# Patient Record
Sex: Male | Born: 1970 | Race: Asian | Hispanic: No | Marital: Married | Smoking: Former smoker
Health system: Southern US, Community
[De-identification: ages and names within clinical notes are randomized; demographics above are authoritative.]

## PROBLEM LIST (undated history)

## (undated) DIAGNOSIS — E785 Hyperlipidemia, unspecified: Secondary | ICD-10-CM

## (undated) DIAGNOSIS — M109 Gout, unspecified: Secondary | ICD-10-CM

## (undated) HISTORY — DX: Gout, unspecified: M10.9

## (undated) HISTORY — DX: Hyperlipidemia, unspecified: E78.5

---

## 2019-11-05 ENCOUNTER — Ambulatory Visit: Payer: Self-pay | Attending: Internal Medicine

## 2019-11-05 DIAGNOSIS — Z23 Encounter for immunization: Secondary | ICD-10-CM

## 2019-11-05 NOTE — Progress Notes (Signed)
   Covid-19 Vaccination Clinic  Name:  Tommaso Cavitt    MRN: [id number] DOB: 1971-04-27  11/05/2019  Mr. Deverick was observed post Covid-19 immunization for 15 minutes without incident. He was provided with Vaccine Information Sheet and instruction to access the V-Safe system.   Mr. Arran was instructed to call 911 with any severe reactions post vaccine: Marland Kitchen Difficulty breathing  . Swelling of face and throat  . A fast heartbeat  . A bad rash all over body  . Dizziness and weakness   Immunizations Administered    Name Date Dose VIS Date Route   Moderna COVID-19 Vaccine 11/05/2019 12:46 PM 0.5 mL 06/2019 Intramuscular   Manufacturer: Moderna   Lot: [id number]   NDC: 74715-953-96

## 2019-12-03 ENCOUNTER — Ambulatory Visit: Payer: Self-pay | Attending: Internal Medicine

## 2019-12-03 DIAGNOSIS — Z23 Encounter for immunization: Secondary | ICD-10-CM

## 2019-12-03 NOTE — Progress Notes (Signed)
   Covid-19 Vaccination Clinic  Name:  Jeremy Flynn    MRN: [id number] DOB: November 02, 1970  12/03/2019  Mr. Jeremy Flynn was observed post Covid-19 immunization for 15 minutes without incident. He was provided with Vaccine Information Sheet and instruction to access the V-Safe system.   Mr. Jeremy Flynn was instructed to call 911 with any severe reactions post vaccine: Marland Kitchen Difficulty breathing  . Swelling of face and throat  . A fast heartbeat  . A bad rash all over body  . Dizziness and weakness   Immunizations Administered    Name Date Dose VIS Date Route   Moderna COVID-19 Vaccine 12/03/2019 12:48 PM 0.5 mL 06/2019 Intramuscular   Manufacturer: Moderna   Lot: [id number]   NDC: 33582-518-98

## 2020-03-06 ENCOUNTER — Other Ambulatory Visit: Payer: Self-pay

## 2020-03-06 ENCOUNTER — Ambulatory Visit: Payer: Self-pay

## 2020-03-06 ENCOUNTER — Ambulatory Visit (INDEPENDENT_AMBULATORY_CARE_PROVIDER_SITE_OTHER): Payer: No Typology Code available for payment source | Admitting: Family Medicine

## 2020-03-06 ENCOUNTER — Ambulatory Visit (HOSPITAL_COMMUNITY)
Admission: EM | Admit: 2020-03-06 | Discharge: 2020-03-06 | Disposition: A | Discharge status: Home / Self Care | Payer: No Typology Code available for payment source

## 2020-03-06 ENCOUNTER — Encounter (HOSPITAL_COMMUNITY): Payer: Self-pay | Admitting: Emergency Medicine

## 2020-03-06 ENCOUNTER — Encounter: Payer: Self-pay | Admitting: Family Medicine

## 2020-03-06 ENCOUNTER — Ambulatory Visit (INDEPENDENT_AMBULATORY_CARE_PROVIDER_SITE_OTHER): Payer: No Typology Code available for payment source

## 2020-03-06 VITALS — HR 78 | Ht 64.0 in | Wt 145.0 lb

## 2020-03-06 DIAGNOSIS — R358 Other polyuria: Secondary | ICD-10-CM | POA: Diagnosis not present

## 2020-03-06 DIAGNOSIS — M25571 Pain in right ankle and joints of right foot: Secondary | ICD-10-CM

## 2020-03-06 DIAGNOSIS — R3589 Other polyuria: Secondary | ICD-10-CM

## 2020-03-06 DIAGNOSIS — M10071 Idiopathic gout, right ankle and foot: Secondary | ICD-10-CM | POA: Diagnosis not present

## 2020-03-06 DIAGNOSIS — M79671 Pain in right foot: Secondary | ICD-10-CM

## 2020-03-06 DIAGNOSIS — R2241 Localized swelling, mass and lump, right lower limb: Secondary | ICD-10-CM

## 2020-03-06 MED ORDER — IBUPROFEN 600 MG PO TABS: 600.0000 mg | ORAL_TABLET | Freq: Three times a day (TID) | ORAL | 0 refills | Status: AC | PRN

## 2020-03-06 MED ORDER — PREDNISONE 5 MG PO TABS: ORAL_TABLET | ORAL | 0 refills | Status: DC

## 2020-03-06 NOTE — Discharge Instructions (Addendum)
Your x-ray did not show any fractures or broken bones.  There could be some ligament injury We will put you in a cam walker boot and have you follow with orthopedics. Rest, ice, elevate and ibuprofen for pain as needed.

## 2020-03-06 NOTE — Patient Instructions (Signed)
NIce to meet you Please try the prednisone  You can stop the CAM walker once the pain is gone  I will call with the results from today   Please send me a message in MyChart with any questions or updates.  Please see me back in 2-3 weeks.   --Dr. Jordan Likes

## 2020-03-06 NOTE — Assessment & Plan Note (Signed)
Significant effusion without injury of the ankle joint.  Hyperechoic area to suggest gouty origin based on ultrasound. -Counseled supportive care. -Has some polyuria and has not been to the doctor in several years.  Will check kidney function and A1c if he is diabetic. -Prednisone. -Could consider uric acid going forward.

## 2020-03-06 NOTE — Progress Notes (Signed)
Jeremy Flynn - 50 y.o. male MRN [id number]  Date of birth: 01-27-1971  SUBJECTIVE:  Including CC & ROS.  Chief Complaint  Patient presents with  . Ankle Injury    right x 2 weeks    Jeremy Flynn is a 49 y.o. male that is presenting with right ankle pain.  Denies any inciting event or trauma.  Has been ongoing for 2 weeks.  Does get improvement with ibuprofen but the pain returns.  Has limited range of motion in his ankle.  It is worse with long periods of standing.  No history of surgery.  No history of similar pain.  It is localized to the ankle joint..  Independent review of the right ankle x-ray from 8/25 shows soft tissue swelling.   Review of Systems See HPI   HISTORY: Past Medical, Surgical, Social, and Family History Reviewed & Updated per EMR.   Pertinent Historical Findings include:  No past medical history on file.  No past surgical history on file.  No family history on file.  Social History   Socioeconomic History  . Marital status: Single    Spouse name: Not on file  . Number of children: Not on file  . Years of education: Not on file  . Highest education level: Not on file  Occupational History  . Not on file  Tobacco Use  . Smoking status: Current Every Day Smoker    Packs/day: 0.50  . Smokeless tobacco: Never Used  Substance and Sexual Activity  . Alcohol use: Yes    Comment: sometimes  . Drug use: Never  . Sexual activity: Not on file  Other Topics Concern  . Not on file  Social History Narrative  . Not on file   Social Determinants of Health   Financial Resource Strain:   . Difficulty of Paying Living Expenses: Not on file  Food Insecurity:   . Worried About Programme researcher, broadcasting/film/video in the Last Year: Not on file  . Ran Out of Food in the Last Year: Not on file  Transportation Needs:   . Lack of Transportation (Medical): Not on file  . Lack of Transportation (Non-Medical): Not on file  Physical Activity:   . Days of Exercise per Week: Not on  file  . Minutes of Exercise per Session: Not on file  Stress:   . Feeling of Stress : Not on file  Social Connections:   . Frequency of Communication with Friends and Family: Not on file  . Frequency of Social Gatherings with Friends and Family: Not on file  . Attends Religious Services: Not on file  . Active Member of Clubs or Organizations: Not on file  . Attends Banker Meetings: Not on file  . Marital Status: Not on file  Intimate Partner Violence:   . Fear of Current or Ex-Partner: Not on file  . Emotionally Abused: Not on file  . Physically Abused: Not on file  . Sexually Abused: Not on file     PHYSICAL EXAM:  VS: Pulse 78   Ht 5\' 4"  (1.626 m)   Wt 145 lb (65.8 kg)   BMI 24.89 kg/m  Physical Exam Gen: NAD, alert, cooperative with exam, well-appearing MSK:  Right ankle: Limited plantar flexion dorsiflexion. Tenderness to palpation of the ankle joint. No instability. Normal strength resistance. Neurovascular intact  Limited ultrasound: Right ankle:  Large effusion of the ankle joint. Significant hyperemia associated with the ankle joint capsule. Normal-appearing peroneal tendons. Normal-appearing posterior tibialis.  Summary: Findings would suggest gouty origin as to the inflammatory change and effusion of the ankle joint.  Ultrasound and interpretation by Clare Gandy, MD    ASSESSMENT & PLAN:   Acute idiopathic gout of right ankle Significant effusion without injury of the ankle joint.  Hyperechoic area to suggest gouty origin based on ultrasound. -Counseled supportive care. -Has some polyuria and has not been to the doctor in several years.  Will check kidney function and A1c if he is diabetic. -Prednisone. -Could consider uric acid going forward.

## 2020-03-06 NOTE — ED Triage Notes (Signed)
Pt presents to [facility] for assessment of right ankle/foot pain starting 2 weeks ago.  States pain has not been improving.  Denies specific injury.  States he is on his feet for work his whole shift.

## 2020-03-07 ENCOUNTER — Telehealth: Payer: Self-pay | Admitting: Family Medicine

## 2020-03-07 LAB — BASIC METABOLIC PANEL
BUN/Creatinine Ratio: 12 (ref 9–20)
BUN: 11 mg/dL (ref 6–24)
CO2: 23 mmol/L (ref 20–29)
Calcium: 9.4 mg/dL (ref 8.7–10.2)
Chloride: 104 mmol/L (ref 96–106)
Creatinine, Ser: 0.9 mg/dL (ref 0.76–1.27)
GFR calc Af Amer: 116 mL/min/{1.73_m2} (ref >59)
GFR calc non Af Amer: 101 mL/min/{1.73_m2} (ref >59)
Glucose: 132 mg/dL — ABNORMAL HIGH (ref 65–99)
Potassium: 4.2 mmol/L (ref 3.5–5.2)
Sodium: 140 mmol/L (ref 134–144)

## 2020-03-07 LAB — HEMOGLOBIN A1C
Est. average glucose Bld gHb Est-mCnc: 97 mg/dL
Hgb A1c MFr Bld: 5 % (ref 4.8–5.6)

## 2020-03-07 NOTE — Telephone Encounter (Signed)
Informed of results.   Myra Rude, MD [facility] 03/07/2020, 1:29 PM

## 2020-03-07 NOTE — ED Provider Notes (Signed)
[facility]    CSN: [id number] Arrival date & time: 03/06/20  1004      History   Chief Complaint Chief Complaint  Patient presents with  . Ankle Pain    HPI Jeremy Flynn is a 49 y.o. male.   Patient is a 49 year old male who presents today with approximate 2 weeks of right ankle/foot pain and swelling.  This started proximally 2 weeks ago and has not been improving.  Denies any specific injury to the foot or ankle.  Does stand a lot at work.  He is able to ambulate on the foot.  No numbness, tingling.     History reviewed. No pertinent past medical history.  Patient Active Problem List   Diagnosis Date Noted  . Acute idiopathic gout of right ankle 03/06/2020    History reviewed. No pertinent surgical history.     Home Medications    Prior to Admission medications   Medication Sig Start Date End Date Taking? Authorizing Provider  acetaminophen (TYLENOL) 325 MG tablet Take 650 mg by mouth every 6 (six) hours as needed.   Yes [provider]  ibuprofen (ADVIL) 600 MG tablet Take 1 tablet (600 mg total) by mouth every 8 (eight) hours as needed for moderate pain. 03/06/20   Dahlia Byes A, NP  predniSONE (DELTASONE) 5 MG tablet Take 6 pills for first day, 5 pills second day, 4 pills third day, 3 pills fourth day, 2 pills the fifth day, and 1 pill sixth day. 03/06/20   Myra Rude, MD    Family History History reviewed. No pertinent family history.  Social History Social History   Tobacco Use  . Smoking status: Current Every Day Smoker    Packs/day: 0.50  . Smokeless tobacco: Never Used  Substance Use Topics  . Alcohol use: Yes    Comment: sometimes  . Drug use: Never     Allergies   Patient has no known allergies.   Review of Systems Review of Systems   Physical Exam Triage Vital Signs ED Triage Vitals [03/06/20 1132]  Enc Vitals Group     BP (!) 144/88     Pulse Rate 60     Resp 16     Temp 98 F (36.7 C)     Temp  Source Oral     SpO2 100 %     Weight      Height      Head Circumference      Peak Flow      Pain Score 8     Pain Loc      Pain Edu?      Excl. in GC?    No data found.  Updated Vital Signs BP (!) 144/88 (BP Location: Right Arm)   Pulse 60   Temp 98 F (36.7 C) (Oral)   Resp 16   SpO2 100%   Visual Acuity Right Eye Distance:   Left Eye Distance:   Bilateral Distance:    Right Eye Near:   Left Eye Near:    Bilateral Near:     Physical Exam Vitals and nursing note reviewed.  Constitutional:      Appearance: Normal appearance.  HENT:     Head: Normocephalic and atraumatic.     Nose: Nose normal.  Eyes:     Conjunctiva/sclera: Conjunctivae normal.  Pulmonary:     Effort: Pulmonary effort is normal.  Musculoskeletal:     Cervical back: Normal range of motion.  Right ankle: Swelling present. Tenderness present over the lateral malleolus and medial malleolus. Decreased range of motion.  Skin:    General: Skin is warm and dry.  Neurological:     Mental Status: He is alert.  Psychiatric:        Mood and Affect: Mood normal.      UC Treatments / Results  Labs (all labs ordered are listed, but only abnormal results are displayed) Labs Reviewed - No data to display  EKG   Radiology DG Ankle Complete Right  Result Date: 03/06/2020 CLINICAL DATA:  Pain and swelling EXAM: RIGHT ANKLE - COMPLETE 3+ VIEW COMPARISON:  None. FINDINGS: Frontal, oblique, and lateral views were obtained. There is soft tissue swelling. No fracture evident. There is an ankle joint effusion. There is no appreciable joint space narrowing or erosion. There is a small posterior calcaneal spur. Ankle mortise appears intact. IMPRESSION: No fracture. Soft tissue swelling with joint effusion. Question underlying ligamentous injury. No appreciable joint space narrowing. Ankle mortise appears intact. There is a small posterior calcaneal spur. Electronically Signed   By: Bretta Bang III M.D.    On: 03/06/2020 12:53    Procedures Procedures (including critical care time)  Medications Ordered in UC Medications - No data to display  Initial Impression / Assessment and Plan / UC Course  I have reviewed the triage vital signs and the nursing notes.  Pertinent labs & imaging results that were available during my care of the patient were reviewed by me and considered in my medical decision making (see chart for details).     Right ankle pain and swelling X-ray without any acute findings but did mention questionable underlying ligamentous injury Will place in cam walker boot Rest, ice, elevate and ibuprofen for pain.  Recommended schedule appointment with sports medicine Final Clinical Impressions(s) / UC Diagnoses   Final diagnoses:  Acute right ankle pain     Discharge Instructions     Your x-ray did not show any fractures or broken bones.  There could be some ligament injury We will put you in a cam walker boot and have you follow with orthopedics. Rest, ice, elevate and ibuprofen for pain as needed.    ED Prescriptions    Medication Sig Dispense Auth. Provider   ibuprofen (ADVIL) 600 MG tablet Take 1 tablet (600 mg total) by mouth every 8 (eight) hours as needed for moderate pain. 30 tablet Dahlia Byes A, NP     PDMP not reviewed this encounter.   Janace Aris, NP 03/07/20 1237

## 2020-04-03 ENCOUNTER — Ambulatory Visit (INDEPENDENT_AMBULATORY_CARE_PROVIDER_SITE_OTHER): Payer: No Typology Code available for payment source | Admitting: Family Medicine

## 2020-04-03 ENCOUNTER — Other Ambulatory Visit: Payer: Self-pay

## 2020-04-03 ENCOUNTER — Encounter: Payer: Self-pay | Admitting: Family Medicine

## 2020-04-03 VITALS — BP 158/92 | HR 59 | Ht 64.0 in | Wt 140.0 lb

## 2020-04-03 DIAGNOSIS — M10071 Idiopathic gout, right ankle and foot: Secondary | ICD-10-CM

## 2020-04-03 MED ORDER — INDOMETHACIN 50 MG PO CAPS: 50.0000 mg | ORAL_CAPSULE | Freq: Two times a day (BID) | ORAL | 1 refills | Status: DC

## 2020-04-03 NOTE — Patient Instructions (Signed)
Good to see you Please try the medicine. Please take it until you have three days of no pain and then stop.  I will call with the lab result from today   Please send me a message in MyChart with any questions or updates.  Please see me back in 6-8 weeks.   --Dr. Jordan Likes

## 2020-04-03 NOTE — Assessment & Plan Note (Signed)
Pain and swelling and function have improved.  Does likely have a component of gout.  Still gets pain and pain returned somewhat after he finished the prednisone. -Counseled on home exercise therapy and supportive care. -Heel lifts. -Uric acid. -Indocin. -Could consider injection if needed.

## 2020-04-03 NOTE — Progress Notes (Signed)
  Jeremy Flynn - 49 y.o. male MRN [id number]  Date of birth: [birth year]  SUBJECTIVE:  Including CC & ROS.  Chief Complaint  Patient presents with  . Follow-up    right ankle    Jeremy Flynn is a 49 y.o. male that is following up for his right ankle pain.  He did get improvement with the prednisone.  Still notices the pain with some weightbearing.  He works about 7 days a week.   Review of Systems See HPI   HISTORY: Past Medical, Surgical, Social, and Family History Reviewed & Updated per EMR.   Pertinent Historical Findings include:  No past medical history on file.  No past surgical history on file.  No family history on file.  Social History   Socioeconomic History  . Marital status: Single    Spouse name: Not on file  . Number of children: Not on file  . Years of education: Not on file  . Highest education level: Not on file  Occupational History  . Not on file  Tobacco Use  . Smoking status: Current Every Day Smoker    Packs/day: 0.50  . Smokeless tobacco: Never Used  Substance and Sexual Activity  . Alcohol use: Yes    Comment: sometimes  . Drug use: Never  . Sexual activity: Not on file  Other Topics Concern  . Not on file  Social History Narrative  . Not on file   Social Determinants of Health   Financial Resource Strain:   . Difficulty of Paying Living Expenses: Not on file  Food Insecurity:   . Worried About Programme researcher, broadcasting/film/video in the Last Year: Not on file  . Ran Out of Food in the Last Year: Not on file  Transportation Needs:   . Lack of Transportation (Medical): Not on file  . Lack of Transportation (Non-Medical): Not on file  Physical Activity:   . Days of Exercise per Week: Not on file  . Minutes of Exercise per Session: Not on file  Stress:   . Feeling of Stress : Not on file  Social Connections:   . Frequency of Communication with Friends and Family: Not on file  . Frequency of Social Gatherings with Friends and Family: Not on file  .  Attends Religious Services: Not on file  . Active Member of Clubs or Organizations: Not on file  . Attends Banker Meetings: Not on file  . Marital Status: Not on file  Intimate Partner Violence:   . Fear of Current or Ex-Partner: Not on file  . Emotionally Abused: Not on file  . Physically Abused: Not on file  . Sexually Abused: Not on file     PHYSICAL EXAM:  VS: BP (!) 158/92   Pulse (!) 59   Ht 5\' 4"  (1.626 m)   Wt 140 lb (63.5 kg)   BMI 24.03 kg/m  Physical Exam Gen: NAD, alert, cooperative with exam, well-appearing MSK:  Right ankle: Effusion improved. Normal range of motion. Normal strength resistance. Some tenderness to palpation of posterior compartment. Neurovascularly intact     ASSESSMENT & PLAN:   Acute idiopathic gout of right ankle Pain and swelling and function have improved.  Does likely have a component of gout.  Still gets pain and pain returned somewhat after he finished the prednisone. -Counseled on home exercise therapy and supportive care. -Heel lifts. -Uric acid. -Indocin. -Could consider injection if needed.

## 2020-04-04 ENCOUNTER — Telehealth: Payer: Self-pay | Admitting: Family Medicine

## 2020-04-04 LAB — URIC ACID: Uric Acid: 8.1 mg/dL (ref 3.8–8.4)

## 2020-04-04 MED ORDER — ALLOPURINOL 100 MG PO TABS: 100.0000 mg | ORAL_TABLET | Freq: Every day | ORAL | 1 refills | Status: AC

## 2020-04-04 NOTE — Telephone Encounter (Signed)
Informed of results. Will send in allopurinol.   Myra Rude, MD [facility] 04/04/2020, 4:55 PM

## 2020-05-16 ENCOUNTER — Encounter: Payer: Self-pay | Admitting: Family Medicine

## 2020-05-16 ENCOUNTER — Ambulatory Visit (INDEPENDENT_AMBULATORY_CARE_PROVIDER_SITE_OTHER): Payer: No Typology Code available for payment source | Admitting: Family Medicine

## 2020-05-16 ENCOUNTER — Other Ambulatory Visit: Payer: Self-pay

## 2020-05-16 DIAGNOSIS — M10071 Idiopathic gout, right ankle and foot: Secondary | ICD-10-CM

## 2020-05-16 NOTE — Assessment & Plan Note (Signed)
Pain has resolved.  His uric acid was elevated.  Likely secondary to gout. -Counseled supportive care diet. - Could consider starting allopurinol

## 2020-05-16 NOTE — Progress Notes (Signed)
  Jeremy Flynn - 49 y.o. male MRN [id number]  Date of birth: 12-28-1970  SUBJECTIVE:  Including CC & ROS.  Chief Complaint  Patient presents with  . Follow-up    right ankle    Jeremy Flynn is a 48 y.o. male that is following up for his right ankle pain.  He reports no pain today   Review of Systems See HPI   HISTORY: Past Medical, Surgical, Social, and Family History Reviewed & Updated per EMR.   Pertinent Historical Findings include:  No past medical history on file.  No past surgical history on file.  No family history on file.  Social History   Socioeconomic History  . Marital status: Single    Spouse name: Not on file  . Number of children: Not on file  . Years of education: Not on file  . Highest education level: Not on file  Occupational History  . Not on file  Tobacco Use  . Smoking status: Current Every Day Smoker    Packs/day: 0.50  . Smokeless tobacco: Never Used  Substance and Sexual Activity  . Alcohol use: Yes    Comment: sometimes  . Drug use: Never  . Sexual activity: Not on file  Other Topics Concern  . Not on file  Social History Narrative  . Not on file   Social Determinants of Health   Financial Resource Strain:   . Difficulty of Paying Living Expenses: Not on file  Food Insecurity:   . Worried About Programme researcher, broadcasting/film/video in the Last Year: Not on file  . Ran Out of Food in the Last Year: Not on file  Transportation Needs:   . Lack of Transportation (Medical): Not on file  . Lack of Transportation (Non-Medical): Not on file  Physical Activity:   . Days of Exercise per Week: Not on file  . Minutes of Exercise per Session: Not on file  Stress:   . Feeling of Stress : Not on file  Social Connections:   . Frequency of Communication with Friends and Family: Not on file  . Frequency of Social Gatherings with Friends and Family: Not on file  . Attends Religious Services: Not on file  . Active Member of Clubs or Organizations: Not on file  .  Attends Banker Meetings: Not on file  . Marital Status: Not on file  Intimate Partner Violence:   . Fear of Current or Ex-Partner: Not on file  . Emotionally Abused: Not on file  . Physically Abused: Not on file  . Sexually Abused: Not on file     PHYSICAL EXAM:  VS: BP 132/88   Pulse 60   Ht 5\' 4"  (1.626 m)   Wt 145 lb (65.8 kg)   BMI 24.89 kg/m  Physical Exam Gen: NAD, alert, cooperative with exam, well-appearing MSK:  Right ankle: Normal range of motion. No effusion. Normal strength resistance. Neurovascularly intact     ASSESSMENT & PLAN:   Acute idiopathic gout of right ankle Pain has resolved.  His uric acid was elevated.  Likely secondary to gout. -Counseled supportive care diet. - Could consider starting allopurinol

## 2020-06-05 ENCOUNTER — Other Ambulatory Visit: Payer: Self-pay | Admitting: Family Medicine

## 2020-06-05 DIAGNOSIS — M10071 Idiopathic gout, right ankle and foot: Secondary | ICD-10-CM

## 2020-07-01 ENCOUNTER — Encounter: Payer: Self-pay | Admitting: Registered Nurse

## 2020-07-01 ENCOUNTER — Ambulatory Visit: Payer: No Typology Code available for payment source | Admitting: Registered Nurse

## 2020-07-01 ENCOUNTER — Other Ambulatory Visit: Payer: Self-pay

## 2020-07-01 VITALS — BP 122/70 | HR 68 | Temp 97.6°F | Ht 64.0 in | Wt 138.4 lb

## 2020-07-01 DIAGNOSIS — Z130 Encounter for screening for diseases of the blood and blood-forming organs and certain disorders involving the immune mechanism: Secondary | ICD-10-CM

## 2020-07-01 DIAGNOSIS — Z13228 Encounter for screening for other metabolic disorders: Secondary | ICD-10-CM

## 2020-07-01 DIAGNOSIS — Z1329 Encounter for screening for other suspected endocrine disorder: Secondary | ICD-10-CM | POA: Diagnosis not present

## 2020-07-01 DIAGNOSIS — Z7689 Persons encountering health services in other specified circumstances: Secondary | ICD-10-CM

## 2020-07-01 DIAGNOSIS — Z13220 Encounter for screening for lipoid disorders: Secondary | ICD-10-CM

## 2020-07-01 DIAGNOSIS — Z23 Encounter for immunization: Secondary | ICD-10-CM

## 2020-07-01 DIAGNOSIS — E790 Hyperuricemia without signs of inflammatory arthritis and tophaceous disease: Secondary | ICD-10-CM

## 2020-07-01 DIAGNOSIS — Z1159 Encounter for screening for other viral diseases: Secondary | ICD-10-CM

## 2020-07-01 NOTE — Progress Notes (Signed)
New Patient Office Visit  Subjective:  Patient ID: Jeremy Flynn, male    DOB: [birth year]  Age: 49 y.o. MRN: [id number]  CC:  Chief Complaint  Patient presents with   Establish Care    Wants a primary provider   Annual Exam    No other concerns    HPI Rector Devonshire presents for visit to est care.  Needs pcp  Hx of gout - tx in sports med - no longer taking allopurinol as he is not in pain  Interested in getting labs done  Histories reviewed with patient, updated as warranted  Past Medical History:  Diagnosis Date   Gout     History reviewed. No pertinent surgical history.  History reviewed. No pertinent family history.  Social History   Socioeconomic History   Marital status: Married    Spouse name: Not on file   Number of children: 3   Years of education: Not on file   Highest education level: Not on file  Occupational History   Not on file  Tobacco Use   Smoking status: Current Every Day Smoker    Packs/day: 0.50    Years: 20.00    Pack years: 10.00   Smokeless tobacco: Never Used  Vaping Use   Vaping Use: Never used  Substance and Sexual Activity   Alcohol use: Yes    Comment: sometimes   Drug use: Never   Sexual activity: Not Currently  Other Topics Concern   Not on file  Social History Narrative   Not on file   Social Determinants of Health   Financial Resource Strain: Not on file  Food Insecurity: Not on file  Transportation Needs: Not on file  Physical Activity: Not on file  Stress: Not on file  Social Connections: Not on file  Intimate Partner Violence: Not on file    ROS Review of Systems  Constitutional: Negative.   HENT: Negative.   Eyes: Negative.   Respiratory: Negative.   Cardiovascular: Negative.   Gastrointestinal: Negative.   Endocrine: Negative.   Genitourinary: Negative.   Musculoskeletal: Negative.   Skin: Negative.   Allergic/Immunologic: Negative.   Neurological: Negative.   Hematological:  Negative.   Psychiatric/Behavioral: Negative.   All other systems reviewed and are negative.   Objective:   Today's Vitals: BP 122/70    Pulse 68    Temp 97.6 F (36.4 C) (Temporal)    Ht 5\' 4"  (1.626 m)    Wt 138 lb 6.4 oz (62.8 kg)    SpO2 99%    BMI 23.76 kg/m   Physical Exam Vitals reviewed.  Constitutional:      Appearance: Normal appearance. He is normal weight.  Cardiovascular:     Rate and Rhythm: Normal rate and regular rhythm.     Heart sounds: Normal heart sounds. No murmur heard. No friction rub. No gallop.   Pulmonary:     Effort: Pulmonary effort is normal. No respiratory distress.     Breath sounds: Normal breath sounds. No stridor. No wheezing, rhonchi or rales.  Chest:     Chest wall: No tenderness.  Skin:    General: Skin is warm and dry.  Neurological:     General: No focal deficit present.     Mental Status: He is alert and oriented to person, place, and time. Mental status is at baseline.  Psychiatric:        Mood and Affect: Mood normal.        Behavior: Behavior normal.  Thought Content: Thought content normal.        Judgment: Judgment normal.     Assessment & Plan:   Problem List Items Addressed This Visit      Other   Hyperuricemia   Relevant Orders   Uric Acid    Other Visit Diagnoses    Encounter to establish care    -  Primary   Need for diphtheria-tetanus-pertussis (Tdap) vaccine       Relevant Orders   Tdap vaccine greater than or equal to 7yo IM   Screening for endocrine, metabolic and immunity disorder       Relevant Orders   Comprehensive metabolic panel   Hemoglobin A1c   CBC With Differential   TSH   Lipid screening       Relevant Orders   Lipid panel   Encounter for screening for other viral diseases       Relevant Orders   HIV antibody (with reflex)   Hepatitis C Antibody      Outpatient Encounter Medications as of 07/01/2020  Medication Sig   acetaminophen (TYLENOL) 325 MG tablet Take 650 mg by mouth  every 6 (six) hours as needed.   ibuprofen (ADVIL) 600 MG tablet Take 1 tablet (600 mg total) by mouth every 8 (eight) hours as needed for moderate pain.   allopurinol (ZYLOPRIM) 100 MG tablet Take 1 tablet (100 mg total) by mouth daily. (Patient not taking: Reported on 07/01/2020)   [DISCONTINUED] indomethacin (INDOCIN) 50 MG capsule Take 1 capsule (50 mg total) by mouth 2 (two) times daily with a meal.   [DISCONTINUED] predniSONE (DELTASONE) 5 MG tablet Take 6 pills for first day, 5 pills second day, 4 pills third day, 3 pills fourth day, 2 pills the fifth day, and 1 pill sixth day.   No facility-administered encounter medications on file as of 07/01/2020.    Follow-up: No follow-ups on file.   PLAN  Labs collected. Will follow up with the patient as warranted.  Return for cpe and labs within 1 year  Consider restarting allopurinol based on lab results  Patient encouraged to call clinic with any questions, comments, or concerns.  Janeece Agee, NP

## 2020-07-01 NOTE — Patient Instructions (Signed)
° ° ° °  If you have lab work done today you will be contacted with your lab results within the next 2 weeks.  If you have not heard from us then please contact us. The fastest way to get your results is to register for My Chart. ° ° °IF you received an x-ray today, you will receive an invoice from [facility]. Please contact [facility] at [phone] with questions or concerns regarding your invoice.  ° °IF you received labwork today, you will receive an invoice from [facility]. Please contact [facility] at [phone] with questions or concerns regarding your invoice.  ° °Our billing staff will not be able to assist you with questions regarding bills from these companies. ° °You will be contacted with the lab results as soon as they are available. The fastest way to get your results is to activate your My Chart account. Instructions are located on the last page of this paperwork. If you have not heard from us regarding the results in 2 weeks, please contact this office. °  ° ° ° °

## 2020-07-02 ENCOUNTER — Other Ambulatory Visit: Payer: Self-pay | Admitting: Registered Nurse

## 2020-07-02 DIAGNOSIS — E782 Mixed hyperlipidemia: Secondary | ICD-10-CM

## 2020-07-02 LAB — COMPREHENSIVE METABOLIC PANEL
ALT: 38 IU/L (ref 0–44)
AST: 24 IU/L (ref 0–40)
Albumin/Globulin Ratio: 1.5 (ref 1.2–2.2)
Albumin: 4.6 g/dL (ref 4.0–5.0)
Alkaline Phosphatase: 114 IU/L (ref 44–121)
BUN/Creatinine Ratio: 13 (ref 9–20)
BUN: 13 mg/dL (ref 6–24)
Bilirubin Total: 0.4 mg/dL (ref 0.0–1.2)
CO2: 22 mmol/L (ref 20–29)
Calcium: 9.3 mg/dL (ref 8.7–10.2)
Chloride: 103 mmol/L (ref 96–106)
Creatinine, Ser: 1 mg/dL (ref 0.76–1.27)
GFR calc Af Amer: 102 mL/min/{1.73_m2} (ref >59)
GFR calc non Af Amer: 89 mL/min/{1.73_m2} (ref >59)
Globulin, Total: 3 g/dL (ref 1.5–4.5)
Glucose: 97 mg/dL (ref 65–99)
Potassium: 4.4 mmol/L (ref 3.5–5.2)
Sodium: 139 mmol/L (ref 134–144)
Total Protein: 7.6 g/dL (ref 6.0–8.5)

## 2020-07-02 LAB — CBC WITH DIFFERENTIAL
Basophils Absolute: 0.1 10*3/uL (ref 0.0–0.2)
Basos: 1 %
EOS (ABSOLUTE): 0.8 10*3/uL — ABNORMAL HIGH (ref 0.0–0.4)
Eos: 8 %
Hematocrit: 43.7 % (ref 37.5–51.0)
Hemoglobin: 13.7 g/dL (ref 13.0–17.7)
Immature Grans (Abs): 0 10*3/uL (ref 0.0–0.1)
Immature Granulocytes: 0 %
Lymphocytes Absolute: 3.6 10*3/uL — ABNORMAL HIGH (ref 0.7–3.1)
Lymphs: 38 %
MCH: 20.2 pg — ABNORMAL LOW (ref 26.6–33.0)
MCHC: 31.4 g/dL — ABNORMAL LOW (ref 31.5–35.7)
MCV: 64 fL — ABNORMAL LOW (ref 79–97)
Monocytes Absolute: 0.5 10*3/uL (ref 0.1–0.9)
Monocytes: 5 %
Neutrophils Absolute: 4.4 10*3/uL (ref 1.4–7.0)
Neutrophils: 48 %
RBC: 6.79 x10E6/uL — ABNORMAL HIGH (ref 4.14–5.80)
RDW: 19.5 % — ABNORMAL HIGH (ref 11.6–15.4)
WBC: 9.4 10*3/uL (ref 3.4–10.8)

## 2020-07-02 LAB — URIC ACID: Uric Acid: 8.5 mg/dL — ABNORMAL HIGH (ref 3.8–8.4)

## 2020-07-02 LAB — LIPID PANEL
Chol/HDL Ratio: 7.3 ratio — ABNORMAL HIGH (ref 0.0–5.0)
Cholesterol, Total: 240 mg/dL — ABNORMAL HIGH (ref 100–199)
HDL: 33 mg/dL — ABNORMAL LOW (ref >39)
LDL Chol Calc (NIH): 125 mg/dL — ABNORMAL HIGH (ref 0–99)
Triglycerides: 457 mg/dL — ABNORMAL HIGH (ref 0–149)
VLDL Cholesterol Cal: 82 mg/dL — ABNORMAL HIGH (ref 5–40)

## 2020-07-02 LAB — HEMOGLOBIN A1C
Est. average glucose Bld gHb Est-mCnc: 94 mg/dL
Hgb A1c MFr Bld: 4.9 % (ref 4.8–5.6)

## 2020-07-02 LAB — TSH: TSH: 2 u[IU]/mL (ref 0.450–4.500)

## 2020-07-02 LAB — HIV ANTIBODY (ROUTINE TESTING W REFLEX): HIV Screen 4th Generation wRfx: NONREACTIVE

## 2020-07-02 LAB — HEPATITIS C ANTIBODY: Hep C Virus Ab: <0.1 s/co ratio (ref 0.0–0.9)

## 2020-07-02 MED ORDER — ATORVASTATIN CALCIUM 40 MG PO TABS: 40.0000 mg | ORAL_TABLET | Freq: Every day | ORAL | 3 refills | Status: DC

## 2020-07-02 MED ORDER — ATORVASTATIN CALCIUM 40 MG PO TABS: 40.0000 mg | ORAL_TABLET | Freq: Every day | ORAL | 3 refills | Status: AC

## 2020-07-02 NOTE — Telephone Encounter (Signed)
Per pt. Request, rerouting this Rx to different pharmacy; [facility] [address]

## 2020-07-02 NOTE — Telephone Encounter (Signed)
Pt called and requested to have the atorvastatin refill rerouted to  [id number] [place], [place] - [address]  [address] [place] [address]  Phone: [phone] Fax: [phone]

## 2020-07-02 NOTE — Progress Notes (Signed)
Good morning -   If we could call patient, labs are back. Cholesterol is far too high. Needs to start atorvastatin 40mg  every day with dinner. I know he doesn't want to be on the allopurinol, but his uric acid is high. We can see if atorvastatin can help lower his uric acid, as data shows that it can help.  I would like to recheck in 3 months.  Thank you  Rich

## 2021-01-06 IMAGING — DX DG ANKLE COMPLETE 3+V*R*
3 series · 3 of 3 positions shown · non-contrast
Comparison: None.

CLINICAL DATA: Pain and swelling

EXAM:
RIGHT ANKLE - COMPLETE 3+ VIEW

[ankle ap]
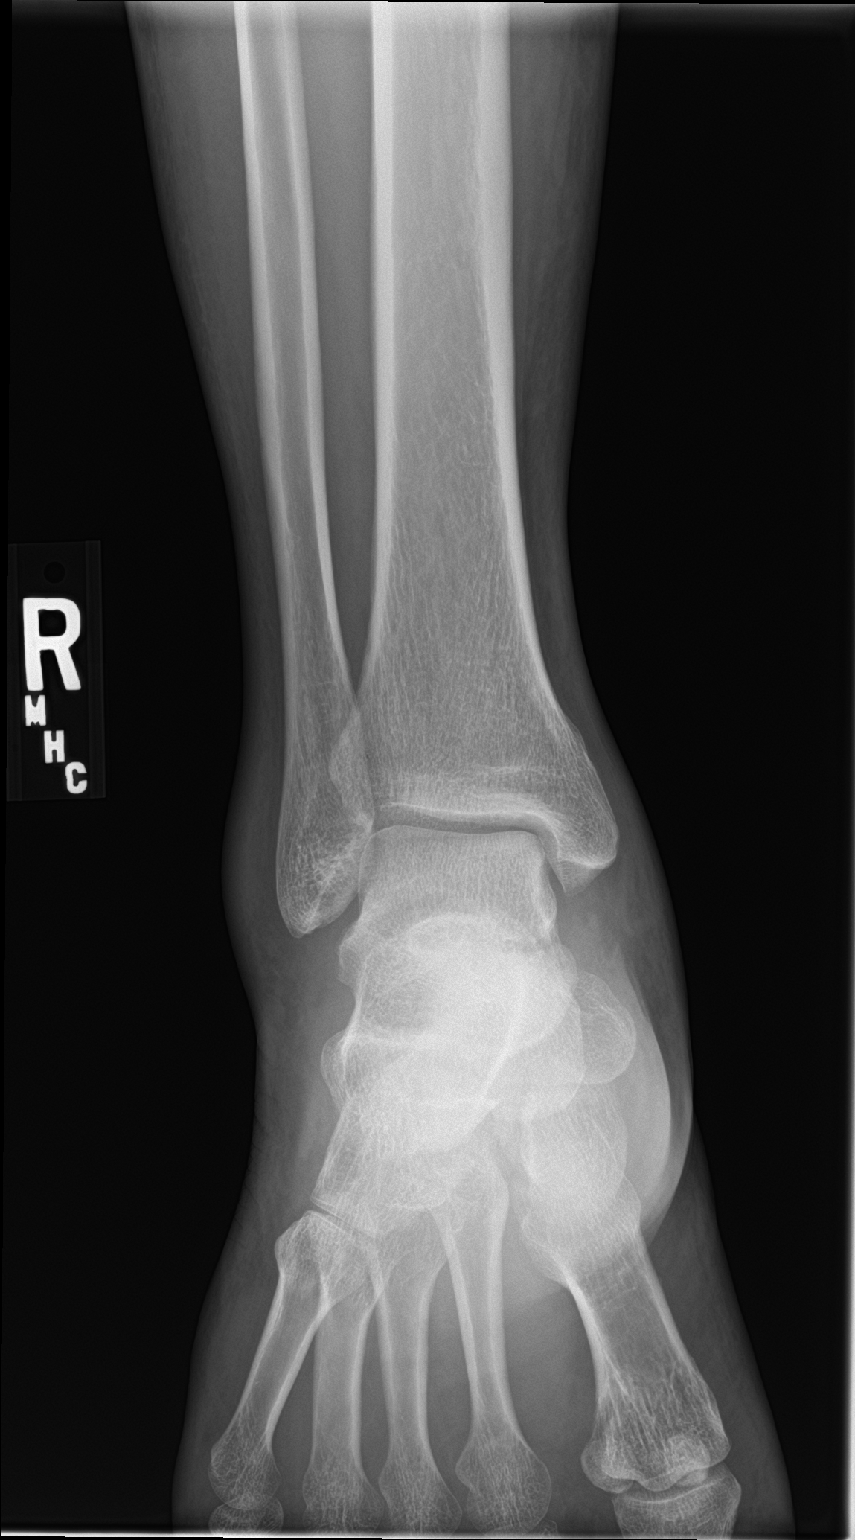

[ankle obl]
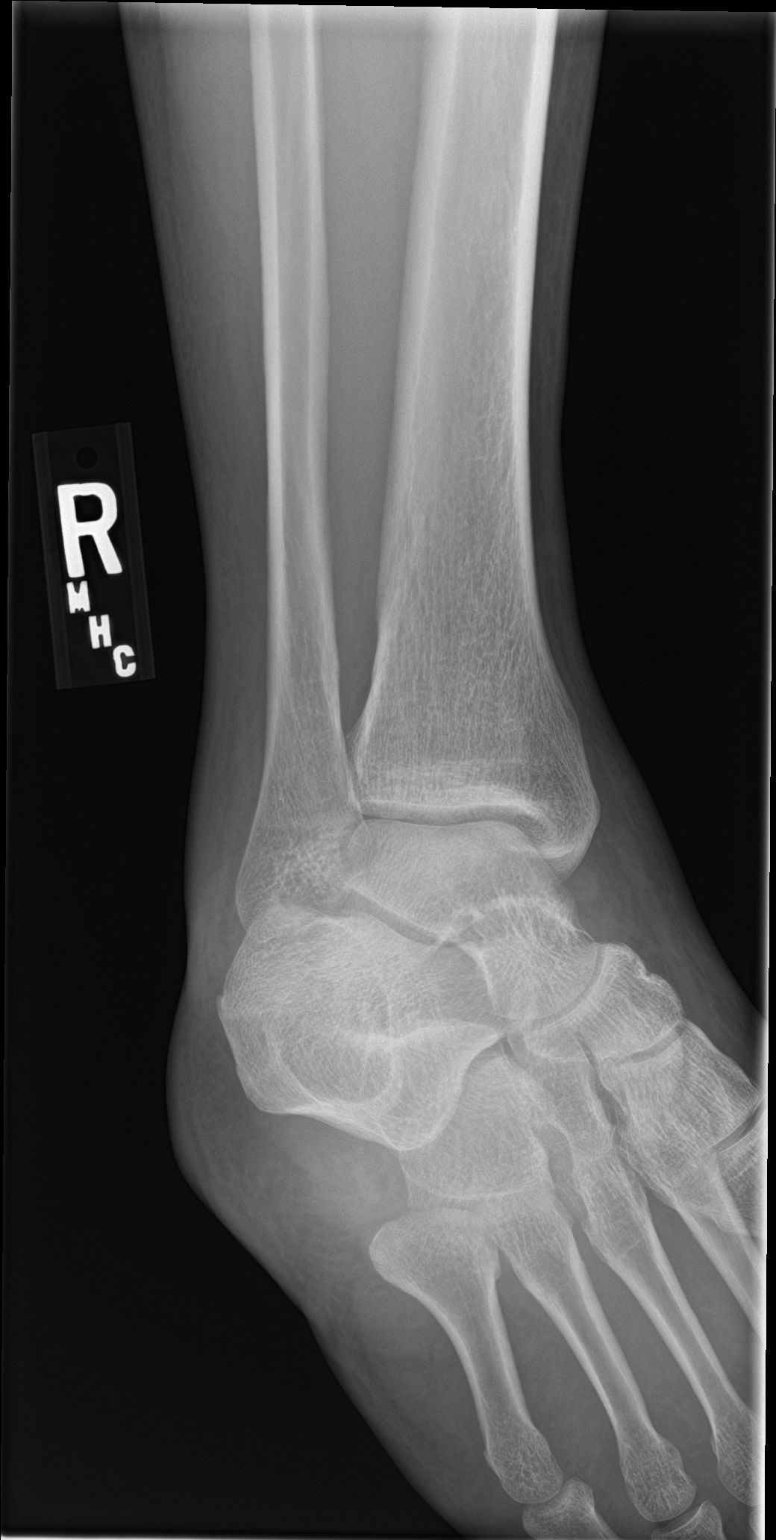

[ankle lat]
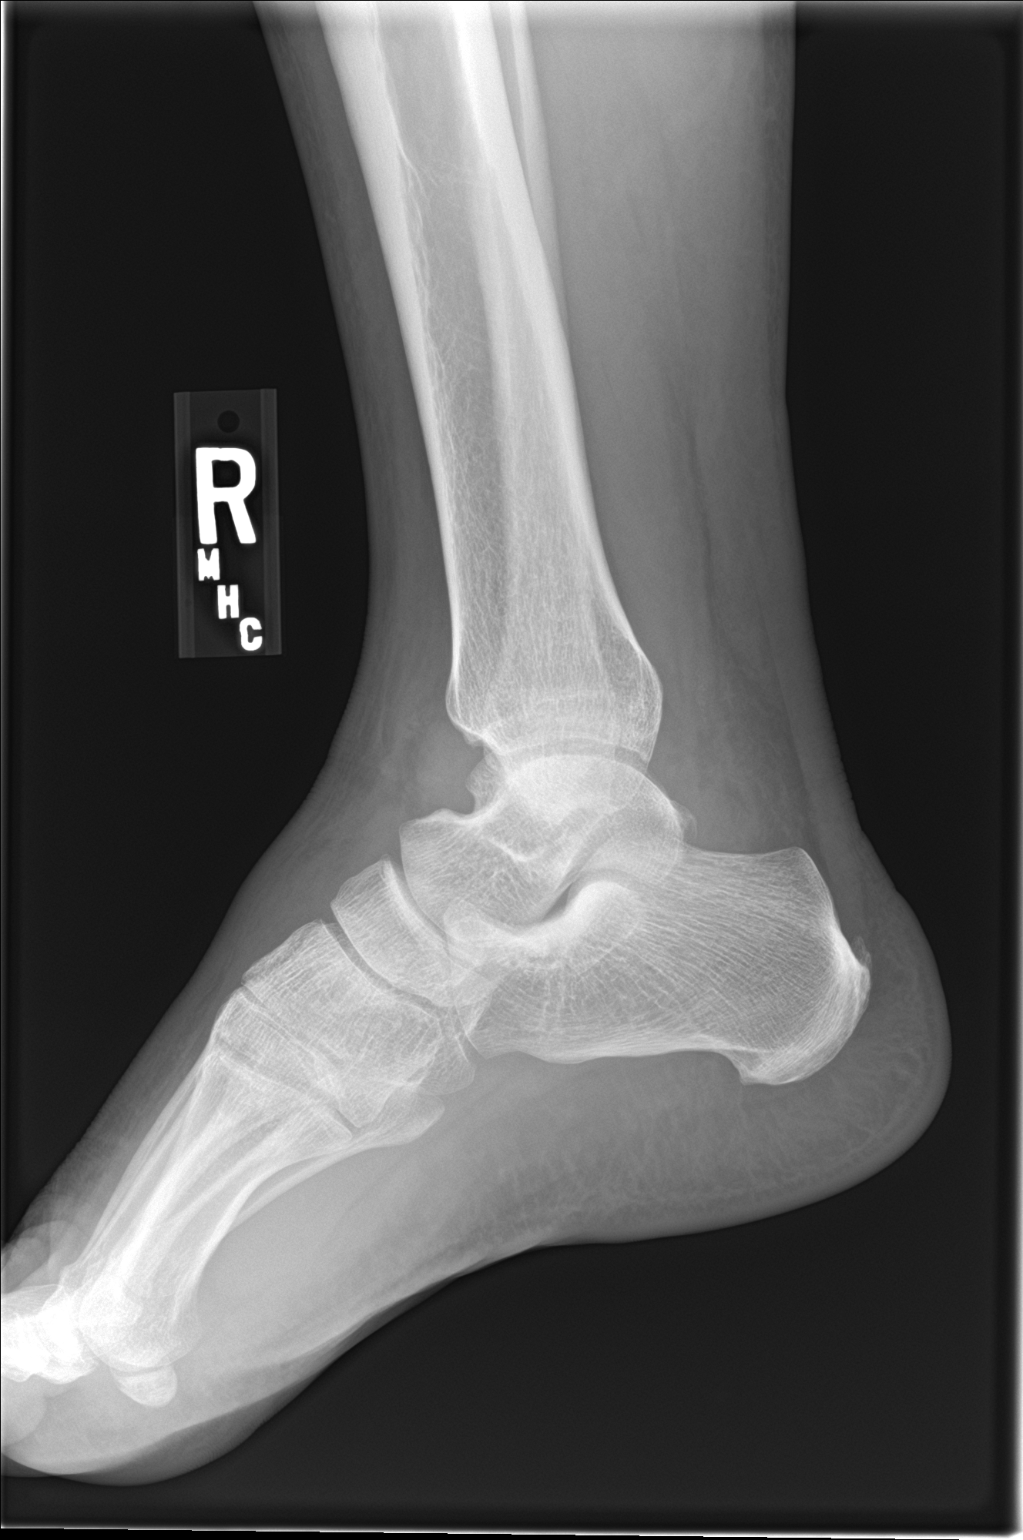

[3 of 3 positions shown; findings below may reference images not displayed]

FINDINGS: Frontal, oblique, and lateral views were obtained. There is soft
tissue swelling. No fracture evident. There is an ankle joint
effusion. There is no appreciable joint space narrowing or erosion.
There is a small posterior calcaneal spur. Ankle mortise appears
intact.
IMPRESSION: No fracture. Soft tissue swelling with joint effusion. Question
underlying ligamentous injury. No appreciable joint space narrowing.
Ankle mortise appears intact. There is a small posterior calcaneal
spur.

## 2022-10-29 ENCOUNTER — Encounter: Payer: Self-pay | Admitting: *Deleted

## 2023-03-18 DIAGNOSIS — R718 Other abnormality of red blood cells: Secondary | ICD-10-CM | POA: Diagnosis not present

## 2023-03-18 DIAGNOSIS — Z125 Encounter for screening for malignant neoplasm of prostate: Secondary | ICD-10-CM | POA: Diagnosis not present

## 2023-03-18 DIAGNOSIS — R030 Elevated blood-pressure reading, without diagnosis of hypertension: Secondary | ICD-10-CM | POA: Diagnosis not present

## 2023-03-18 DIAGNOSIS — Z0000 Encounter for general adult medical examination without abnormal findings: Secondary | ICD-10-CM | POA: Diagnosis not present

## 2023-03-18 DIAGNOSIS — E785 Hyperlipidemia, unspecified: Secondary | ICD-10-CM | POA: Diagnosis not present

## 2023-03-18 DIAGNOSIS — M109 Gout, unspecified: Secondary | ICD-10-CM | POA: Diagnosis not present

## 2023-03-30 DIAGNOSIS — M9907 Segmental and somatic dysfunction of upper extremity: Secondary | ICD-10-CM | POA: Diagnosis not present

## 2023-03-30 DIAGNOSIS — M25511 Pain in right shoulder: Secondary | ICD-10-CM | POA: Diagnosis not present

## 2023-03-30 DIAGNOSIS — M9903 Segmental and somatic dysfunction of lumbar region: Secondary | ICD-10-CM | POA: Diagnosis not present

## 2023-03-30 DIAGNOSIS — M40292 Other kyphosis, cervical region: Secondary | ICD-10-CM | POA: Diagnosis not present

## 2023-03-30 DIAGNOSIS — M25571 Pain in right ankle and joints of right foot: Secondary | ICD-10-CM | POA: Diagnosis not present

## 2023-03-30 DIAGNOSIS — M9902 Segmental and somatic dysfunction of thoracic region: Secondary | ICD-10-CM | POA: Diagnosis not present

## 2023-04-06 DIAGNOSIS — M9903 Segmental and somatic dysfunction of lumbar region: Secondary | ICD-10-CM | POA: Diagnosis not present

## 2023-04-06 DIAGNOSIS — M9902 Segmental and somatic dysfunction of thoracic region: Secondary | ICD-10-CM | POA: Diagnosis not present

## 2023-04-06 DIAGNOSIS — M9901 Segmental and somatic dysfunction of cervical region: Secondary | ICD-10-CM | POA: Diagnosis not present

## 2023-04-06 DIAGNOSIS — M25511 Pain in right shoulder: Secondary | ICD-10-CM | POA: Diagnosis not present

## 2023-04-12 DIAGNOSIS — M9903 Segmental and somatic dysfunction of lumbar region: Secondary | ICD-10-CM | POA: Diagnosis not present

## 2023-04-12 DIAGNOSIS — M25511 Pain in right shoulder: Secondary | ICD-10-CM | POA: Diagnosis not present

## 2023-04-12 DIAGNOSIS — M9902 Segmental and somatic dysfunction of thoracic region: Secondary | ICD-10-CM | POA: Diagnosis not present

## 2023-04-12 DIAGNOSIS — M9901 Segmental and somatic dysfunction of cervical region: Secondary | ICD-10-CM | POA: Diagnosis not present

## 2023-04-15 DIAGNOSIS — M25511 Pain in right shoulder: Secondary | ICD-10-CM | POA: Diagnosis not present

## 2023-04-15 DIAGNOSIS — M9901 Segmental and somatic dysfunction of cervical region: Secondary | ICD-10-CM | POA: Diagnosis not present

## 2023-04-15 DIAGNOSIS — M9903 Segmental and somatic dysfunction of lumbar region: Secondary | ICD-10-CM | POA: Diagnosis not present

## 2023-04-15 DIAGNOSIS — M9902 Segmental and somatic dysfunction of thoracic region: Secondary | ICD-10-CM | POA: Diagnosis not present

## 2023-05-06 DIAGNOSIS — M9902 Segmental and somatic dysfunction of thoracic region: Secondary | ICD-10-CM | POA: Diagnosis not present

## 2023-05-06 DIAGNOSIS — M25511 Pain in right shoulder: Secondary | ICD-10-CM | POA: Diagnosis not present

## 2023-05-06 DIAGNOSIS — M9901 Segmental and somatic dysfunction of cervical region: Secondary | ICD-10-CM | POA: Diagnosis not present

## 2023-05-06 DIAGNOSIS — M9903 Segmental and somatic dysfunction of lumbar region: Secondary | ICD-10-CM | POA: Diagnosis not present

## 2023-06-25 DIAGNOSIS — E785 Hyperlipidemia, unspecified: Secondary | ICD-10-CM | POA: Diagnosis not present

## 2023-08-12 ENCOUNTER — Encounter: Payer: Self-pay | Admitting: Internal Medicine

## 2023-08-26 ENCOUNTER — Ambulatory Visit (AMBULATORY_SURGERY_CENTER): Payer: BC Managed Care – PPO

## 2023-08-26 VITALS — Ht 64.0 in | Wt 137.8 lb

## 2023-08-26 DIAGNOSIS — Z1211 Encounter for screening for malignant neoplasm of colon: Secondary | ICD-10-CM

## 2023-08-26 MED ORDER — NA SULFATE-K SULFATE-MG SULF 17.5-3.13-1.6 GM/177ML PO SOLN: 1.0000 | Freq: Once | ORAL | 0 refills | Status: AC

## 2023-08-26 NOTE — Progress Notes (Signed)
No egg or soy allergy known to patient  No issues known to pt with past sedation with any surgeries or procedures Patient denies ever being told they had issues or difficulty with intubation  (never had surgery) No FH of Malignant Hyperthermia Pt is not on diet pills Pt is not on  home 02  Pt is not on blood thinners  Pt denies issues with constipation  No A fib or A flutter Have any cardiac testing pending--No Pt can ambulate  Pt denies use of chewing tobacco Discussed diabetic I weight loss medication holds Discussed NSAID holds Checked BMI Pt instructed to use Singlecare.com or GoodRx for a price reduction on prep  Patient's chart reviewed by Cathlyn Parsons CNRA prior to previsit and patient appropriate for the [facility].  Pre visit completed and red dot placed by patient's name on their procedure day (on provider's schedule).   Interpreter used during visit:  Lance Sell, CAP

## 2023-09-21 ENCOUNTER — Encounter: Payer: Self-pay | Admitting: Internal Medicine

## 2023-09-28 ENCOUNTER — Ambulatory Visit (AMBULATORY_SURGERY_CENTER): Payer: No Typology Code available for payment source | Admitting: Internal Medicine

## 2023-09-28 ENCOUNTER — Encounter: Payer: Self-pay | Admitting: Internal Medicine

## 2023-09-28 VITALS — BP 121/83 | HR 59 | Temp 97.8°F | Resp 13 | Ht 64.0 in | Wt 137.8 lb

## 2023-09-28 DIAGNOSIS — D123 Benign neoplasm of transverse colon: Secondary | ICD-10-CM

## 2023-09-28 DIAGNOSIS — D124 Benign neoplasm of descending colon: Secondary | ICD-10-CM | POA: Diagnosis not present

## 2023-09-28 DIAGNOSIS — K648 Other hemorrhoids: Secondary | ICD-10-CM | POA: Diagnosis not present

## 2023-09-28 DIAGNOSIS — Z1211 Encounter for screening for malignant neoplasm of colon: Secondary | ICD-10-CM

## 2023-09-28 MED ORDER — SODIUM CHLORIDE 0.9 % IV SOLN: 500.0000 mL | Freq: Once | INTRAVENOUS | Status: DC

## 2023-09-28 NOTE — Op Note (Signed)
 [facility] Patient Name: Jeremy Flynn Procedure Date: 09/28/2023 10:04 AM MRN: [id number] Endoscopist: Madelyn Brunner Sanford , , [id number] Age: 53 Referring MD:  Date of Birth: 1971/04/27 Gender: Male Account #: [id number] Procedure:                Colonoscopy Indications:              Screening for colorectal malignant neoplasm, This                            is the patient's first colonoscopy Medicines:                Monitored Anesthesia Care Procedure:                Pre-Anesthesia Assessment:                           - Prior to the procedure, a History and Physical                            was performed, and patient medications and                            allergies were reviewed. The patient's tolerance of                            previous anesthesia was also reviewed. The risks                            and benefits of the procedure and the sedation                            options and risks were discussed with the patient.                            All questions were answered, and informed consent                            was obtained. Prior Anticoagulants: The patient has                            taken no anticoagulant or antiplatelet agents. ASA                            Grade Assessment: II - A patient with mild systemic                            disease. After reviewing the risks and benefits,                            the patient was deemed in satisfactory condition to                            undergo the procedure.  After obtaining informed consent, the colonoscope                            was passed under direct vision. Throughout the                            procedure, the patient's blood pressure, pulse, and                            oxygen saturations were monitored continuously. The                            CF [id number] #[id number] was introduced through the anus                            and advanced to the  the terminal ileum. The                            colonoscopy was performed without difficulty. The                            patient tolerated the procedure well. The quality                            of the bowel preparation was excellent. The                            terminal ileum, ileocecal valve, appendiceal                            orifice, and rectum were photographed. Scope In: 10:09:53 AM Scope Out: 10:24:22 AM Scope Withdrawal Time: 0 hours 11 minutes 5 seconds  Total Procedure Duration: 0 hours 14 minutes 29 seconds  Findings:                 The terminal ileum appeared normal.                           Two sessile polyps were found in the descending                            colon and transverse colon. The polyps were 3 to 6                            mm in size. These polyps were removed with a cold                            snare. Resection and retrieval were complete.                           Non-bleeding internal hemorrhoids were found during                            retroflexion. Complications:  No immediate complications. Estimated Blood Loss:     Estimated blood loss was minimal. Impression:               - The examined portion of the ileum was normal.                           - Two 3 to 6 mm polyps in the descending colon and                            in the transverse colon, removed with a cold snare.                            Resected and retrieved.                           - Non-bleeding internal hemorrhoids. Recommendation:           - Discharge patient to home (with escort).                           - Await pathology results.                           - The findings and recommendations were discussed                            with the patient. Dr Particia Lather "[place]" [place],  09/28/2023 10:29:38 AM

## 2023-09-28 NOTE — Progress Notes (Signed)
 Vss nad trans to pacu

## 2023-09-28 NOTE — Progress Notes (Signed)
 GASTROENTEROLOGY PROCEDURE H&P NOTE   Primary Care Physician: Janeece Agee, NP    Reason for Procedure:   Colon cancer screening  Plan:    Colonoscopy  Patient is appropriate for endoscopic procedure(s) in the ambulatory ([facility]) setting.  The nature of the procedure, as well as the risks, benefits, and alternatives were carefully and thoroughly reviewed with the patient. Ample time for discussion and questions allowed. The patient understood, was satisfied, and agreed to proceed.     HPI: Jeremy Flynn is a 53 y.o. male who presents for colonoscopy for colon cancer screening. Denies blood in stools, changes in bowel habits, or unintentional weight loss. Denies family history of colon cancer. This is his first colonoscopy.   Past Medical History:  Diagnosis Date   Gout    Hyperlipidemia     History reviewed. No pertinent surgical history.  Prior to Admission medications   Medication Sig Start Date End Date Taking? Authorizing Provider  acetaminophen (TYLENOL) 325 MG tablet Take 650 mg by mouth every 6 (six) hours as needed.    [provider]  allopurinol (ZYLOPRIM) 100 MG tablet Take 1 tablet (100 mg total) by mouth daily. 04/04/20   Myra Rude, MD  atorvastatin (LIPITOR) 40 MG tablet Take 1 tablet (40 mg total) by mouth daily. 07/02/20   Janeece Agee, NP  ibuprofen (ADVIL) 600 MG tablet Take 1 tablet (600 mg total) by mouth every 8 (eight) hours as needed for moderate pain. 03/06/20   Janace Aris, FNP    Current Outpatient Medications  Medication Sig Dispense Refill   acetaminophen (TYLENOL) 325 MG tablet Take 650 mg by mouth every 6 (six) hours as needed.     allopurinol (ZYLOPRIM) 100 MG tablet Take 1 tablet (100 mg total) by mouth daily. 30 tablet 1   atorvastatin (LIPITOR) 40 MG tablet Take 1 tablet (40 mg total) by mouth daily. 90 tablet 3   ibuprofen (ADVIL) 600 MG tablet Take 1 tablet (600 mg total) by mouth every 8 (eight) hours as needed  for moderate pain. 30 tablet 0   Current Facility-Administered Medications  Medication Dose Route Frequency Provider Last Rate Last Admin   0.9 %  sodium chloride infusion  500 mL Intravenous Once Imogene Burn, MD        Allergies as of 09/28/2023   (No Known Allergies)    Family History  Problem Relation Age of Onset   Colon cancer Neg Hx    Esophageal cancer Neg Hx    Rectal cancer Neg Hx    Stomach cancer Neg Hx     Social History   Socioeconomic History   Marital status: Married    Spouse name: Not on file   Number of children: 3   Years of education: Not on file   Highest education level: Not on file  Occupational History   Not on file  Tobacco Use   Smoking status: Former    Current packs/day: 0.00    Average packs/day: 0.5 packs/day for 20.0 years (10.0 ttl pk-yrs)    Types: Cigarettes    Quit date: 2022    Years since quitting: 3.2   Smokeless tobacco: Former  Building services engineer status: Never Used  Substance and Sexual Activity   Alcohol use: Yes    Comment: sometimes   Drug use: Never   Sexual activity: Not Currently  Other Topics Concern   Not on file  Social History Narrative   Not on file  Social Drivers of Corporate investment banker Strain: Not on file  Food Insecurity: Not on file  Transportation Needs: Not on file  Physical Activity: Not on file  Stress: Not on file  Social Connections: Not on file  Intimate Partner Violence: Not on file    Physical Exam: Vital signs in last 24 hours: BP 104/66   Pulse 64   Temp 97.8 F (36.6 C) (Skin)   Ht 5\' 4"  (1.626 m)   Wt 137 lb 12.8 oz (62.5 kg)   SpO2 98%   BMI 23.65 kg/m  GEN: NAD EYE: Sclerae anicteric ENT: MMM CV: Non-tachycardic Pulm: No increased work of breathing GI: Soft, NT/ND NEURO:  Alert & Oriented   Eulah Pont, MD  Gastroenterology  09/28/2023 9:52 AM

## 2023-09-28 NOTE — Progress Notes (Signed)
 Called to room to assist during endoscopic procedure.  Patient ID and intended procedure confirmed with present staff. Received instructions for my participation in the procedure from the performing physician.

## 2023-09-28 NOTE — Patient Instructions (Signed)
-

## 2023-09-28 NOTE — Progress Notes (Signed)
 Pt's states no medical or surgical changes since previsit or office visit.

## 2023-09-29 ENCOUNTER — Telehealth: Payer: Self-pay | Admitting: *Deleted

## 2023-09-29 NOTE — Telephone Encounter (Signed)
  Follow up Call-     09/28/2023    9:49 AM  Call back number  Post procedure Call Back phone  # [phone]  Permission to leave phone message Yes     Patient questions:  Do you have a fever, pain , or abdominal swelling? No. Pain Score  0 *  Have you tolerated food without any problems? No.  Have you been able to return to your normal activities? Yes.    Do you have any questions about your discharge instructions: Diet   No. Medications  No. Follow up visit  No.  Do you have questions or concerns about your Care? No.  Actions: * If pain score is 4 or above: No action needed, pain <4.

## 2023-09-30 ENCOUNTER — Encounter: Payer: Self-pay | Admitting: Internal Medicine

## 2023-09-30 LAB — SURGICAL PATHOLOGY

## 2024-03-17 DIAGNOSIS — Z1389 Encounter for screening for other disorder: Secondary | ICD-10-CM | POA: Diagnosis not present

## 2024-03-17 DIAGNOSIS — Z125 Encounter for screening for malignant neoplasm of prostate: Secondary | ICD-10-CM | POA: Diagnosis not present

## 2024-03-17 DIAGNOSIS — M109 Gout, unspecified: Secondary | ICD-10-CM | POA: Diagnosis not present

## 2024-03-17 DIAGNOSIS — E785 Hyperlipidemia, unspecified: Secondary | ICD-10-CM | POA: Diagnosis not present

## 2024-03-17 DIAGNOSIS — Z1212 Encounter for screening for malignant neoplasm of rectum: Secondary | ICD-10-CM | POA: Diagnosis not present

## 2024-03-24 DIAGNOSIS — R82998 Other abnormal findings in urine: Secondary | ICD-10-CM | POA: Diagnosis not present

## 2024-03-24 DIAGNOSIS — M109 Gout, unspecified: Secondary | ICD-10-CM | POA: Diagnosis not present

## 2024-03-24 DIAGNOSIS — Z1331 Encounter for screening for depression: Secondary | ICD-10-CM | POA: Diagnosis not present

## 2024-03-24 DIAGNOSIS — Z0000 Encounter for general adult medical examination without abnormal findings: Secondary | ICD-10-CM | POA: Diagnosis not present

## 2024-03-24 DIAGNOSIS — Z1389 Encounter for screening for other disorder: Secondary | ICD-10-CM | POA: Diagnosis not present
